# Patient Record
Sex: Male | Born: 1980 | Race: Black or African American | Hispanic: No | Marital: Married | Smoking: Never smoker
Health system: Southern US, Community
[De-identification: ages and names within clinical notes are randomized; demographics above are authoritative.]

## PROBLEM LIST (undated history)

## (undated) HISTORY — PX: FACIAL FRACTURE SURGERY: SHX1570

---

## 2014-07-18 ENCOUNTER — Encounter (HOSPITAL_BASED_OUTPATIENT_CLINIC_OR_DEPARTMENT_OTHER): Payer: Self-pay | Admitting: Emergency Medicine

## 2014-07-18 ENCOUNTER — Emergency Department (HOSPITAL_BASED_OUTPATIENT_CLINIC_OR_DEPARTMENT_OTHER): Payer: Managed Care, Other (non HMO)

## 2014-07-18 ENCOUNTER — Emergency Department (HOSPITAL_BASED_OUTPATIENT_CLINIC_OR_DEPARTMENT_OTHER)
Admission: EM | Admit: 2014-07-18 | Discharge: 2014-07-18 | Disposition: A | Discharge status: Home / Self Care | Payer: Managed Care, Other (non HMO) | Attending: Emergency Medicine | Admitting: Emergency Medicine

## 2014-07-18 DIAGNOSIS — S99929A Unspecified injury of unspecified foot, initial encounter: Secondary | ICD-10-CM | POA: Diagnosis present

## 2014-07-18 DIAGNOSIS — R296 Repeated falls: Secondary | ICD-10-CM | POA: Insufficient documentation

## 2014-07-18 DIAGNOSIS — Y9239 Other specified sports and athletic area as the place of occurrence of the external cause: Secondary | ICD-10-CM | POA: Diagnosis not present

## 2014-07-18 DIAGNOSIS — Z880 Allergy status to penicillin: Secondary | ICD-10-CM | POA: Diagnosis not present

## 2014-07-18 DIAGNOSIS — Y9364 Activity, baseball: Secondary | ICD-10-CM | POA: Insufficient documentation

## 2014-07-18 DIAGNOSIS — S99919A Unspecified injury of unspecified ankle, initial encounter: Secondary | ICD-10-CM

## 2014-07-18 DIAGNOSIS — S9000XA Contusion of unspecified ankle, initial encounter: Secondary | ICD-10-CM | POA: Insufficient documentation

## 2014-07-18 DIAGNOSIS — M79672 Pain in left foot: Secondary | ICD-10-CM

## 2014-07-18 DIAGNOSIS — S8990XA Unspecified injury of unspecified lower leg, initial encounter: Secondary | ICD-10-CM | POA: Insufficient documentation

## 2014-07-18 DIAGNOSIS — Y92838 Other recreation area as the place of occurrence of the external cause: Secondary | ICD-10-CM

## 2014-07-18 MED ORDER — HYDROCODONE-ACETAMINOPHEN 5-325 MG PO TABS: 1.0000 | ORAL_TABLET | Freq: Four times a day (QID) | ORAL | Status: DC | PRN

## 2014-07-18 MED ORDER — NAPROXEN 500 MG PO TABS: 500.0000 mg | ORAL_TABLET | Freq: Two times a day (BID) | ORAL | Status: DC

## 2014-07-18 MED ORDER — KETOROLAC TROMETHAMINE 30 MG/ML IJ SOLN
30.0000 mg | Freq: Once | INTRAMUSCULAR | Status: AC
  Administered 2014-07-18: 30 mg via INTRAMUSCULAR
  Filled 2014-07-18: qty 1

## 2014-07-18 MED ORDER — KETOROLAC TROMETHAMINE 30 MG/ML IJ SOLN: 30.0000 mg | Freq: Once | INTRAMUSCULAR | Status: DC

## 2014-07-18 NOTE — Discharge Instructions (Signed)

## 2014-07-18 NOTE — ED Provider Notes (Signed)
Medical screening examination/treatment/procedure(s) were performed by non-physician practitioner and as supervising physician I was immediately available for consultation/collaboration.   EKG Interpretation None        Krist Rosenboom, MD 07/18/14 2240 

## 2014-07-18 NOTE — ED Provider Notes (Signed)
CSN: [id number]     Arrival date & time 07/18/14  1920 History   First MD Initiated Contact with Patient 07/18/14 2005     Chief Complaint  Patient presents with  . Foot Pain    HPI Comments: Patient states that he jumped and landed hard on his left foot while playing softball tonight.  He immediately had pain, but was able to play the rest of the game and walk around.  He denies fever, chills, nausea, vomiting, and rash  Patient is a 33 y.o. male presenting with lower extremity pain. The history is provided by the patient. No language interpreter was used.  Foot Pain This is a new problem. The current episode started today. The problem occurs constantly. The problem has been gradually worsening. Associated symptoms comments: Swelling and bruising. The symptoms are aggravated by walking and bending (pressure). He has tried nothing for the symptoms. The treatment provided no relief.    History reviewed. No pertinent past medical history. Past Surgical History  Procedure Laterality Date  . Facial fracture surgery     No family history on file. History  Substance Use Topics  . Smoking status: Never Smoker   . Smokeless tobacco: Not on file  . Alcohol Use: Yes    Review of Systems See HPI  Allergies  Penicillins  Home Medications   Prior to Admission medications   Medication Sig Start Date End Date Taking? Authorizing Provider  HYDROcodone-acetaminophen (NORCO/VICODIN) 5-325 MG per tablet Take 1-2 tablets by mouth every 6 (six) hours as needed for moderate pain or severe pain. 07/18/14   Belton Peplinski A Forcucci, PA-C  naproxen (NAPROSYN) 500 MG tablet Take 1 tablet (500 mg total) by mouth 2 (two) times daily. 07/18/14   Nyala Kirchner A Forcucci, PA-C   BP 134/78  Pulse 78  Temp(Src) 98.3 F (36.8 C) (Oral)  Resp 16  Ht 5\' 11"  (1.803 m)  Wt 230 lb (104.327 kg)  BMI 32.09 kg/m2  SpO2 98% Physical Exam  Nursing note and vitals reviewed. Constitutional: He appears well-developed and  well-nourished. No distress.  HENT:  Head: Normocephalic and atraumatic.  Mouth/Throat: Oropharynx is clear and moist. No oropharyngeal exudate.  Cardiovascular: Normal rate, regular rhythm, normal heart sounds and intact distal pulses.  Exam reveals no gallop and no friction rub.   No murmur heard. Pulmonary/Chest: Effort normal and breath sounds normal. No respiratory distress. He has no wheezes. He has no rales. He exhibits no tenderness.  Musculoskeletal:       Left ankle: He exhibits ecchymosis. He exhibits normal range of motion, no swelling, no deformity, no laceration and normal pulse. No tenderness. No lateral malleolus, no medial malleolus, no AITFL, no CF ligament, no posterior TFL, no head of 5th metatarsal and no proximal fibula tenderness found. Achilles tendon normal. Achilles tendon exhibits no pain, no defect and normal Thompson's test results.       Left foot: He exhibits bony tenderness. He exhibits normal range of motion, no tenderness, no swelling, normal capillary refill, no crepitus, no deformity and no laceration.  Skin: He is not diaphoretic.    ED Course  Procedures (including critical care time) Labs Review Labs Reviewed - No data to display  Imaging Review Dg Foot Complete Left  07/18/2014   CLINICAL DATA:  Injury to the left heel complaining of foot pain.  EXAM: LEFT FOOT - COMPLETE 3+ VIEW  COMPARISON:  No priors.  FINDINGS: Multiple views of the left foot demonstrate no acute displaced fracture, subluxation,  dislocation, or soft tissue abnormality.  IMPRESSION: No acute radiographic abnormality of the left foot.   Electronically Signed   By: Trudie Reed M.D.   On: 07/18/2014 19:56     EKG Interpretation None      MDM   Final diagnoses:  Heel pain, left   Patient is a 33 y.o. Male who presents with left heel and left ankle pain after injury tonight.  Patient is neurovascularly intact at this time.  Plain film xrays of the foot reveal no acute  fractures.  Given heel tenderness suspect that this is likely a heel contusion vs. Deltoid ligament sprain.  Have treated the patient here with toradol and have placed the patient in an ASO and given crutches.  Patient was prescribed hydrocodone and naproxen.  Patient was told to ice and elevate as much as possible.  He will follow-up with [facility] at this time for further evaluation. Patient was told to return for septic joint symptoms.  Patient states understanding and agreement.    Eben Burow, PA-C 07/18/14 2209

## 2014-07-18 NOTE — ED Notes (Signed)
Pt was playing baseball and when he landed on a base he developed pain in his left heel.

## 2014-08-05 ENCOUNTER — Encounter: Payer: Self-pay | Admitting: Family Medicine

## 2014-08-05 ENCOUNTER — Ambulatory Visit (INDEPENDENT_AMBULATORY_CARE_PROVIDER_SITE_OTHER): Payer: Managed Care, Other (non HMO) | Admitting: Family Medicine

## 2014-08-05 VITALS — BP 136/83 | HR 80 | Ht 71.0 in | Wt 230.0 lb

## 2014-08-05 DIAGNOSIS — M79609 Pain in unspecified limb: Secondary | ICD-10-CM

## 2014-08-05 DIAGNOSIS — M79672 Pain in left foot: Secondary | ICD-10-CM

## 2014-08-05 NOTE — Patient Instructions (Signed)
You have strained your plantar fascia Take tylenol or aleve as needed for pain  Lowering/raise on a step exercises 3 x 10 once or twice a day for 4 weeks. Ice heel for 15 minutes as needed including after sports, running. Avoid flat shoes/barefoot walking as much as possible. Arch straps have been shown to help with pain - wear when up and walking around for next 4 weeks. Wear supportive shoes - can consider something like dr. Jari Sportsman active series insoles at most but I don't think you'll need these. Steroid injection is a consideration for short term pain relief if you are struggling. Physical therapy is also an option. Follow up with me as needed.

## 2014-08-06 ENCOUNTER — Encounter: Payer: Self-pay | Admitting: Family Medicine

## 2014-08-06 DIAGNOSIS — M79672 Pain in left foot: Secondary | ICD-10-CM | POA: Insufficient documentation

## 2014-08-06 NOTE — Assessment & Plan Note (Signed)
Left plantar fascia strain - Tylenol/aleve if needed.  Icing, arch binders.  Arch supports.  Activities as tolerated.  Reviewed home exercise program.  Consider physical therapy if not improving.  F/u prn.

## 2014-08-06 NOTE — Progress Notes (Signed)
Patient ID: Billy Schultz, male   DOB: 01/30/1981, 33 y.o.   MRN: [id number]  PCP: Primus Bravo, NP  Subjective:   HPI: Patient is a 33 y.o. male here for left heel pain.  Patient reports he was playing softball when he stepped on a base wrong. Hyperdorsiflexed left ankle with ball of foot on the base. Immediate pain in bottom of foot/heel. No swelling or bruising. Kept playing that day but pain worse by the end of game. Took naproxen which has helped. Pain mostly resolved - 3/10 when touched in area of pain. No prior issues.  History reviewed. No pertinent past medical history.  Current Outpatient Prescriptions on File Prior to Visit  Medication Sig Dispense Refill  . HYDROcodone-acetaminophen (NORCO/VICODIN) 5-325 MG per tablet Take 1-2 tablets by mouth every 6 (six) hours as needed for moderate pain or severe pain.  10 tablet  0  . naproxen (NAPROSYN) 500 MG tablet Take 1 tablet (500 mg total) by mouth 2 (two) times daily.  30 tablet  0   No current facility-administered medications on file prior to visit.    Past Surgical History  Procedure Laterality Date  . Facial fracture surgery      Allergies  Allergen Reactions  . Penicillins Anaphylaxis    History   Social History  . Marital Status: Married    Spouse Name: N/A    Number of Children: N/A  . Years of Education: N/A   Occupational History  . Not on file.   Social History Main Topics  . Smoking status: Never Smoker   . Smokeless tobacco: Not on file  . Alcohol Use: Yes  . Drug Use: No  . Sexual Activity: Not on file   Other Topics Concern  . Not on file   Social History Narrative  . No narrative on file    No family history on file.  BP 136/83  Pulse 80  Ht  (1.803 m)  Wt 230 lb (104.327 kg)  BMI 32.09 kg/m2  Review of Systems: See HPI above.    Objective:  Physical Exam:  Gen: NAD  Left foot/ankle: No gross deformity, swelling, ecchymoses FROM with 5/5 strength all ankle  motions. TTP plantar portion of heel at insertion of plantar fascia.  No other tenderness. Negative ant drawer and talar tilt.   Negative syndesmotic compression. Negative calcaneal squeeze. Thompsons test negative. NV intact distally.    Assessment & Plan:  1. Left plantar fascia strain - Tylenol/aleve if needed.  Icing, arch binders.  Arch supports.  Activities as tolerated.  Reviewed home exercise program.  Consider physical therapy if not improving.  F/u prn.

## 2015-11-25 IMAGING — CR DG FOOT COMPLETE 3+V*L*
3 series · 3 of 3 positions shown · non-contrast
Comparison: No priors.

CLINICAL DATA: Injury to the left heel complaining of foot pain.

EXAM:
LEFT FOOT - COMPLETE 3+ VIEW

[t foot ap left]
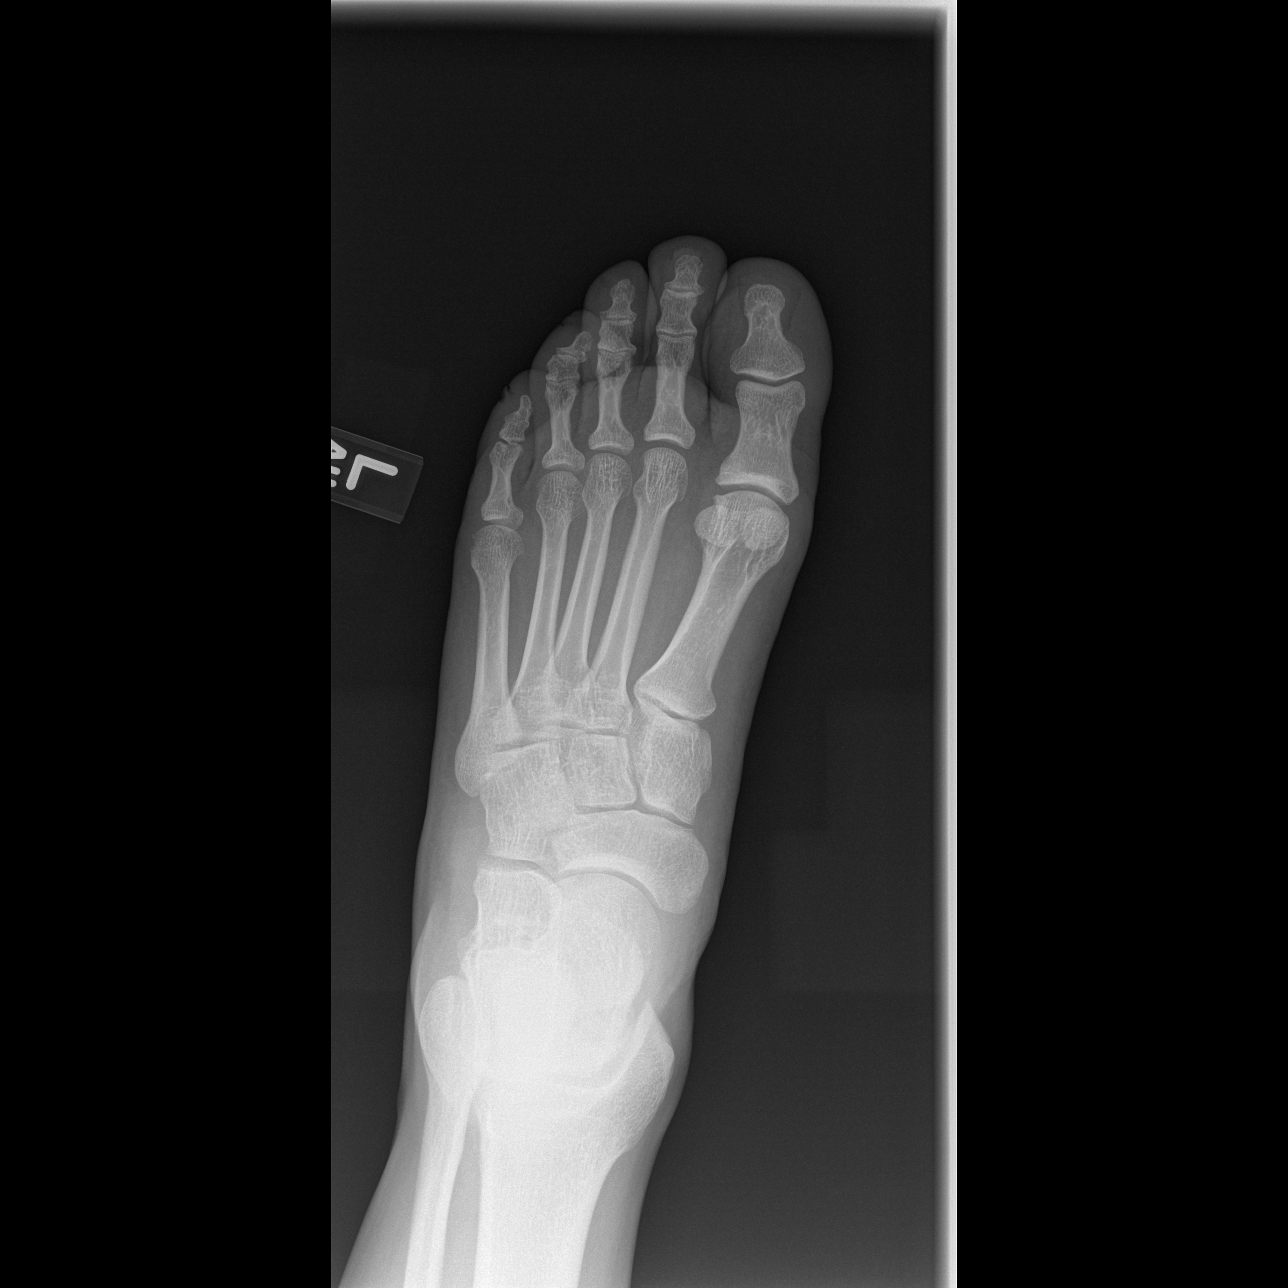

[t foot oblique left]
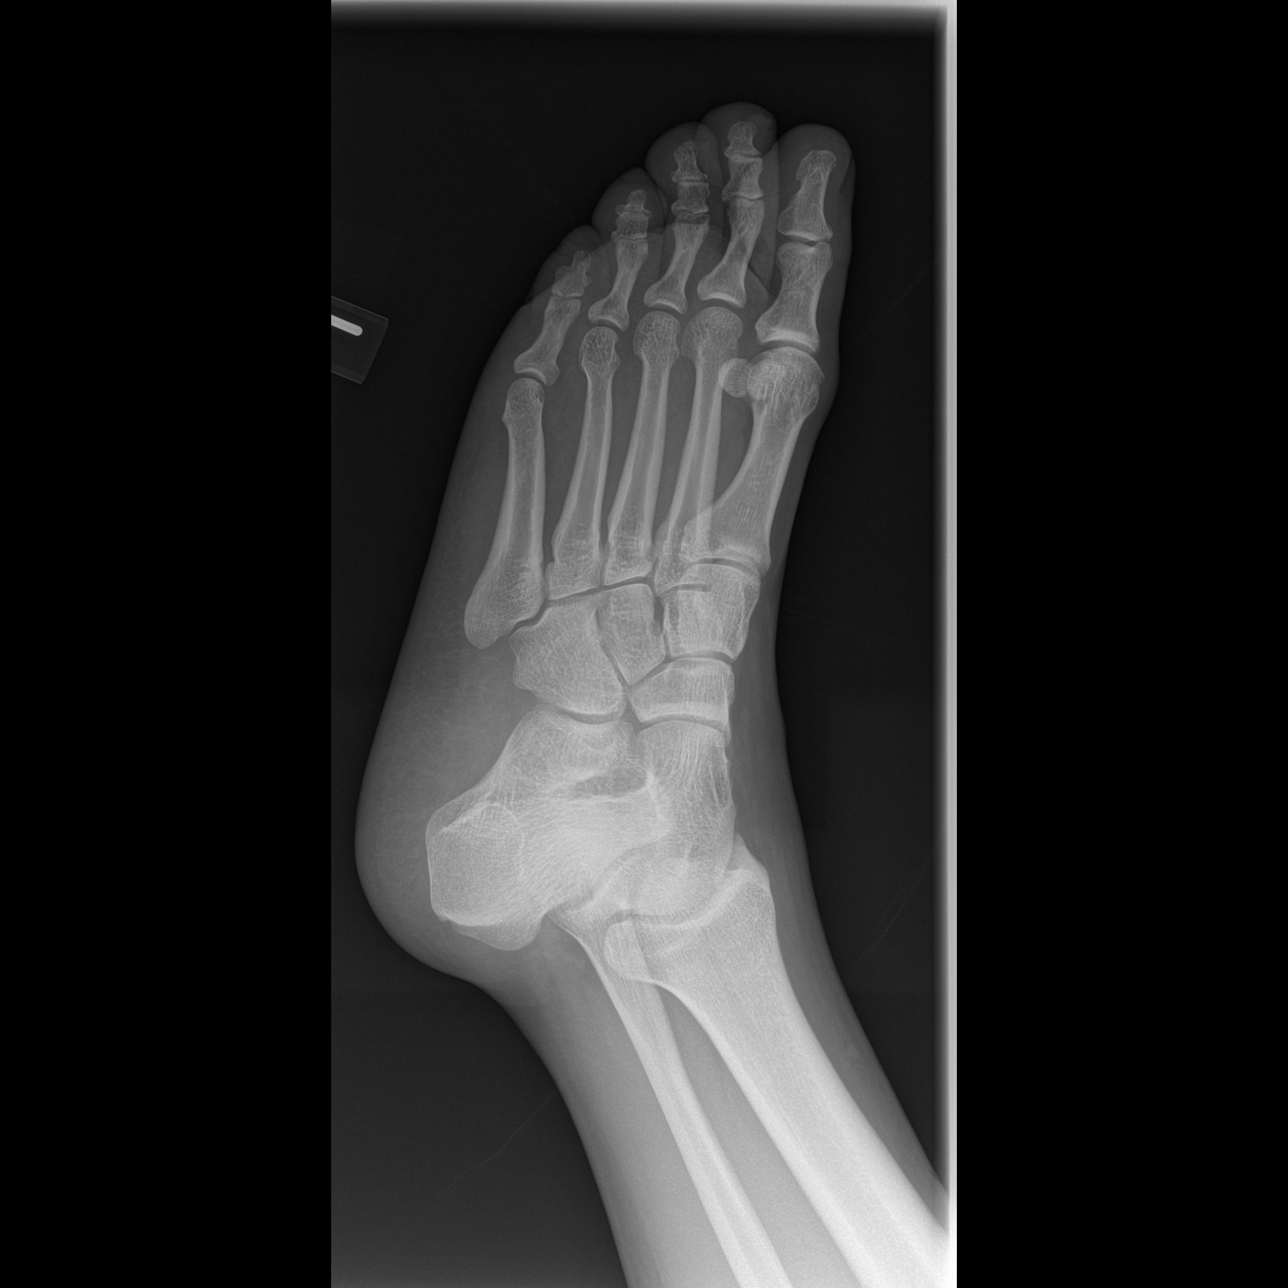

[t foot lat left]
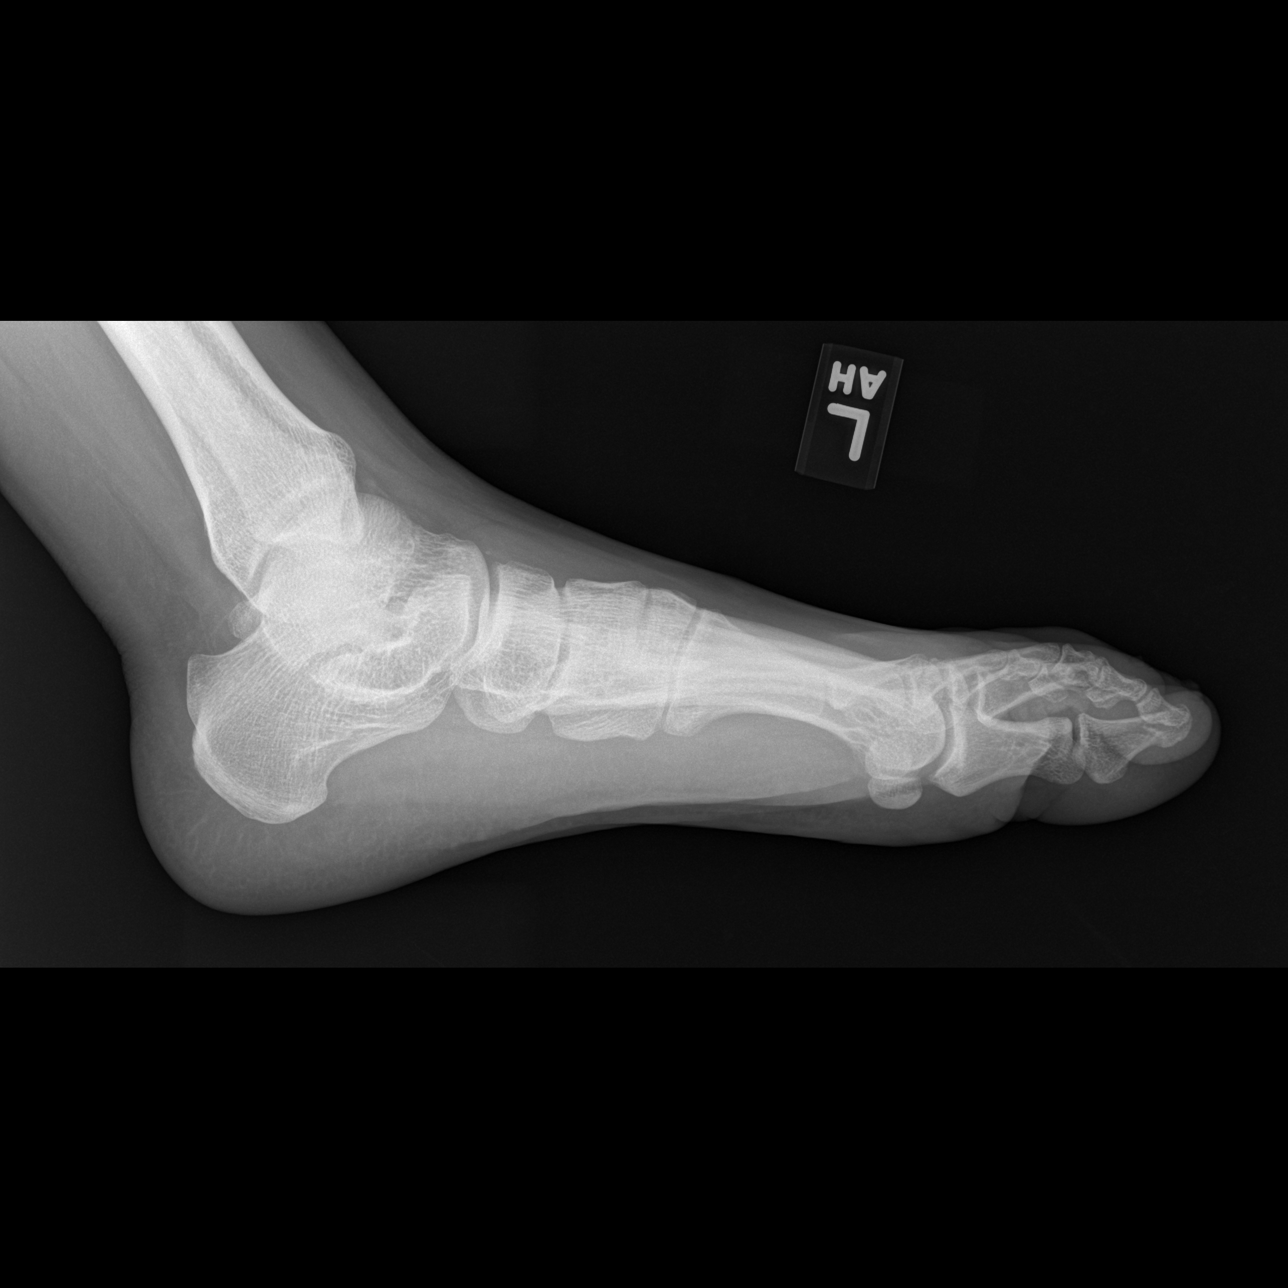

[3 of 3 positions shown; findings below may reference images not displayed]

FINDINGS: Multiple views of the left foot demonstrate no acute displaced
fracture, subluxation, dislocation, or soft tissue abnormality.
IMPRESSION: No acute radiographic abnormality of the left foot.

## 2020-04-25 ENCOUNTER — Ambulatory Visit
Admission: EM | Admit: 2020-04-25 | Discharge: 2020-04-25 | Disposition: A | Discharge status: Home / Self Care | Payer: BC Managed Care – PPO | Attending: Emergency Medicine | Admitting: Emergency Medicine

## 2020-04-25 DIAGNOSIS — M25562 Pain in left knee: Secondary | ICD-10-CM

## 2020-04-25 MED ORDER — PREDNISONE 10 MG (21) PO TBPK: ORAL_TABLET | Freq: Every day | ORAL | 0 refills | Status: AC

## 2020-04-25 NOTE — ED Provider Notes (Signed)
[facility]    CSN: [id number] Arrival date & time: 04/25/20  1049      History   Chief Complaint Chief Complaint  Patient presents with  . Knee Pain    HPI Billy Schultz is a 39 y.o. male presenting for left knee pain since Saturday.  States he sat for long period of time Friday at a friend's house.  Denies injury, increased activity level.  Has more knee sleeve with some relief.  Patient states he had fluid on his knee in high school: This feels similar.  Denies history of DVT, PE.  No lower extremity edema.   History reviewed. No pertinent past medical history.  Patient Active Problem List   Diagnosis Date Noted  . Pain of left heel 08/06/2014    Past Surgical History:  Procedure Laterality Date  . FACIAL FRACTURE SURGERY         Home Medications    Prior to Admission medications   Medication Sig Start Date End Date Taking? Authorizing Provider  predniSONE (STERAPRED UNI-PAK 21 TAB) 10 MG (21) TBPK tablet Take by mouth daily. Take steroid taper as written 04/25/20   Hall-Potvin, [place], PA-C    Family History History reviewed. No pertinent family history.  Social History Social History   Tobacco Use  . Smoking status: Never Smoker  . Smokeless tobacco: Never Used  Substance Use Topics  . Alcohol use: Yes  . Drug use: No     Allergies   Penicillins   Review of Systems As per HPI   Physical Exam Triage Vital Signs ED Triage Vitals  Enc Vitals Group     BP      Pulse      Resp      Temp      Temp src      SpO2      Weight      Height      Head Circumference      Peak Flow      Pain Score      Pain Loc      Pain Edu?      Excl. in GC?    No data found.  Updated Vital Signs BP 137/86 (BP Location: Left Arm)   Pulse 89   Temp 98.3 F (36.8 C) (Oral)   Resp 16   SpO2 97%   Visual Acuity Right Eye Distance:   Left Eye Distance:   Bilateral Distance:    Right Eye Near:   Left Eye Near:    Bilateral Near:       Physical Exam Constitutional:      General: He is not in acute distress. HENT:     Head: Normocephalic and atraumatic.  Eyes:     General: No scleral icterus.    Pupils: Pupils are equal, round, and reactive to light.  Cardiovascular:     Rate and Rhythm: Normal rate.  Pulmonary:     Effort: Pulmonary effort is normal. No respiratory distress.     Breath sounds: No wheezing.  Musculoskeletal:        General: Swelling and tenderness present. Normal range of motion.     Comments: Left knee without deformity.  Mild swelling as compared to right.  Negative blotting test.  No palpable cords, negative Homans' sign.  Knee stable to valgus and varus stress.  Gait unremarkable.  Skin:    Coloration: Skin is not jaundiced or pale.  Neurological:     Mental Status: He is  alert and oriented to person, place, and time.     Deep Tendon Reflexes: Reflexes normal.      UC Treatments / Results  Labs (all labs ordered are listed, but only abnormal results are displayed) Labs Reviewed - No data to display  EKG   Radiology No results found.  Procedures Procedures (including critical care time)  Medications Ordered in UC Medications - No data to display  Initial Impression / Assessment and Plan / UC Course  I have reviewed the triage vital signs and the nursing notes.  Pertinent labs & imaging results that were available during my care of the patient were reviewed by me and considered in my medical decision making (see chart for details).     Exam reassuring: Very low concern for DVT at this time.  Likely small effusion, though mild.  Could also consider Baker's cyst.  Will trial prednisone, continuation of knee sleeve, ice, and have patient follow-up with Ortho if needed.  Return precautions discussed, patient verbalized understanding and is agreeable to plan. Final Clinical Impressions(s) / UC Diagnoses   Final diagnoses:  Acute pain of left knee     Discharge Instructions      Take steroid as discussed: 6-5-4-3-2-1 Recommend RICE: rest, ice, compression, elevation as needed for pain.   Cold therapy (ice packs) can be used to help swelling both after injury and after prolonged use of areas of chronic pain/aches.  For pain: recommend 350 mg-1000 mg of Tylenol (acetaminophen) and/or 200 mg - 800 mg of Advil (ibuprofen, Motrin) every 8 hours as needed.  May alternate between the two throughout the day as they are generally safe to take together.  DO NOT exceed more than 3000 mg of Tylenol or 3200 mg of ibuprofen in a 24 hour period as this could damage your stomach, kidneys, liver, or increase your bleeding risk.    ED Prescriptions    Medication Sig Dispense Auth. Provider   predniSONE (STERAPRED UNI-PAK 21 TAB) 10 MG (21) TBPK tablet Take by mouth daily. Take steroid taper as written 21 tablet Hall-Potvin, [place], PA-C     I have reviewed the PDMP during this encounter.   Hall-Potvin, [place], [place] 04/26/20 [id number]

## 2020-04-25 NOTE — ED Triage Notes (Signed)
Pt c/o fluid on lt knee since Saturday after sitting for a long period of time Friday night. Denies injury, has a knee sleeve on.

## 2020-04-25 NOTE — Discharge Instructions (Signed)
Take steroid as discussed: 6-5-4-3-2-1 Recommend RICE: rest, ice, compression, elevation as needed for pain.   Cold therapy (ice packs) can be used to help swelling both after injury and after prolonged use of areas of chronic pain/aches.  For pain: recommend 350 mg-1000 mg of Tylenol (acetaminophen) and/or 200 mg - 800 mg of Advil (ibuprofen, Motrin) every 8 hours as needed.  May alternate between the two throughout the day as they are generally safe to take together.  DO NOT exceed more than 3000 mg of Tylenol or 3200 mg of ibuprofen in a 24 hour period as this could damage your stomach, kidneys, liver, or increase your bleeding risk.

## 2020-04-26 ENCOUNTER — Encounter: Payer: Self-pay | Admitting: Emergency Medicine
